# Patient Record
Sex: Male | Born: 1970
Health system: Southern US, Community
[De-identification: ages and names within clinical notes are randomized; demographics above are authoritative.]

## PROBLEM LIST (undated history)

## (undated) HISTORY — PX: BREAST SURGERY: SHX581

---

## 2006-03-10 ENCOUNTER — Emergency Department (HOSPITAL_COMMUNITY): Admission: EM | Admit: 2006-03-10 | Discharge: 2006-03-10 | Payer: Self-pay | Admitting: Emergency Medicine

## 2017-06-21 DIAGNOSIS — G5602 Carpal tunnel syndrome, left upper limb: Secondary | ICD-10-CM | POA: Diagnosis not present

## 2018-06-02 DIAGNOSIS — J019 Acute sinusitis, unspecified: Secondary | ICD-10-CM | POA: Diagnosis not present

## 2018-06-21 DIAGNOSIS — N209 Urinary calculus, unspecified: Secondary | ICD-10-CM | POA: Diagnosis not present

## 2018-09-17 DIAGNOSIS — M7061 Trochanteric bursitis, right hip: Secondary | ICD-10-CM | POA: Diagnosis not present

## 2018-09-17 DIAGNOSIS — S76911A Strain of unspecified muscles, fascia and tendons at thigh level, right thigh, initial encounter: Secondary | ICD-10-CM | POA: Diagnosis not present

## 2018-09-20 DIAGNOSIS — M545 Low back pain: Secondary | ICD-10-CM | POA: Diagnosis not present

## 2018-09-20 DIAGNOSIS — M5416 Radiculopathy, lumbar region: Secondary | ICD-10-CM | POA: Diagnosis not present

## 2018-10-01 ENCOUNTER — Ambulatory Visit (HOSPITAL_COMMUNITY)
Admission: RE | Admit: 2018-10-01 | Discharge: 2018-10-01 | Disposition: A | Discharge status: Home / Self Care | Payer: 59 | Source: Ambulatory Visit | Attending: Physician Assistant | Admitting: Physician Assistant

## 2018-10-01 ENCOUNTER — Other Ambulatory Visit (HOSPITAL_COMMUNITY): Payer: Self-pay | Admitting: Physician Assistant

## 2018-10-01 DIAGNOSIS — M160 Bilateral primary osteoarthritis of hip: Secondary | ICD-10-CM | POA: Diagnosis not present

## 2018-10-01 DIAGNOSIS — M48061 Spinal stenosis, lumbar region without neurogenic claudication: Secondary | ICD-10-CM | POA: Insufficient documentation

## 2018-10-01 DIAGNOSIS — M4606 Spinal enthesopathy, lumbar region: Secondary | ICD-10-CM | POA: Diagnosis not present

## 2018-10-01 DIAGNOSIS — M24852 Other specific joint derangements of left hip, not elsewhere classified: Secondary | ICD-10-CM | POA: Diagnosis not present

## 2018-10-01 DIAGNOSIS — M47896 Other spondylosis, lumbar region: Secondary | ICD-10-CM | POA: Diagnosis not present

## 2018-10-01 DIAGNOSIS — M79604 Pain in right leg: Secondary | ICD-10-CM | POA: Diagnosis not present

## 2018-10-01 DIAGNOSIS — M549 Dorsalgia, unspecified: Secondary | ICD-10-CM | POA: Diagnosis not present

## 2018-10-01 DIAGNOSIS — M24851 Other specific joint derangements of right hip, not elsewhere classified: Secondary | ICD-10-CM | POA: Diagnosis not present

## 2018-10-05 DIAGNOSIS — M79604 Pain in right leg: Secondary | ICD-10-CM | POA: Diagnosis not present

## 2018-10-05 DIAGNOSIS — M4696 Unspecified inflammatory spondylopathy, lumbar region: Secondary | ICD-10-CM | POA: Diagnosis not present

## 2018-10-05 DIAGNOSIS — M5416 Radiculopathy, lumbar region: Secondary | ICD-10-CM | POA: Diagnosis not present

## 2018-10-27 DIAGNOSIS — M545 Low back pain: Secondary | ICD-10-CM | POA: Diagnosis not present

## 2018-10-27 DIAGNOSIS — M5416 Radiculopathy, lumbar region: Secondary | ICD-10-CM | POA: Diagnosis not present

## 2018-11-09 DIAGNOSIS — M5416 Radiculopathy, lumbar region: Secondary | ICD-10-CM | POA: Diagnosis not present

## 2018-11-18 DIAGNOSIS — M5416 Radiculopathy, lumbar region: Secondary | ICD-10-CM | POA: Diagnosis not present

## 2018-11-23 DIAGNOSIS — M5416 Radiculopathy, lumbar region: Secondary | ICD-10-CM | POA: Diagnosis not present

## 2018-12-01 DIAGNOSIS — M5416 Radiculopathy, lumbar region: Secondary | ICD-10-CM | POA: Diagnosis not present

## 2019-01-11 DIAGNOSIS — Z6841 Body Mass Index (BMI) 40.0 and over, adult: Secondary | ICD-10-CM | POA: Diagnosis not present

## 2019-01-11 DIAGNOSIS — Z0000 Encounter for general adult medical examination without abnormal findings: Secondary | ICD-10-CM | POA: Diagnosis not present

## 2019-01-11 DIAGNOSIS — Z1389 Encounter for screening for other disorder: Secondary | ICD-10-CM | POA: Diagnosis not present

## 2019-02-13 IMAGING — DX DG LUMBAR SPINE 2-3V
3 series · 3 of 3 positions shown · non-contrast
Comparison: 03/10/2016

CLINICAL DATA: Right-sided low back pain radiating to the right hip

EXAM:
LUMBAR SPINE - 2-3 VIEW

[l-spine ap]
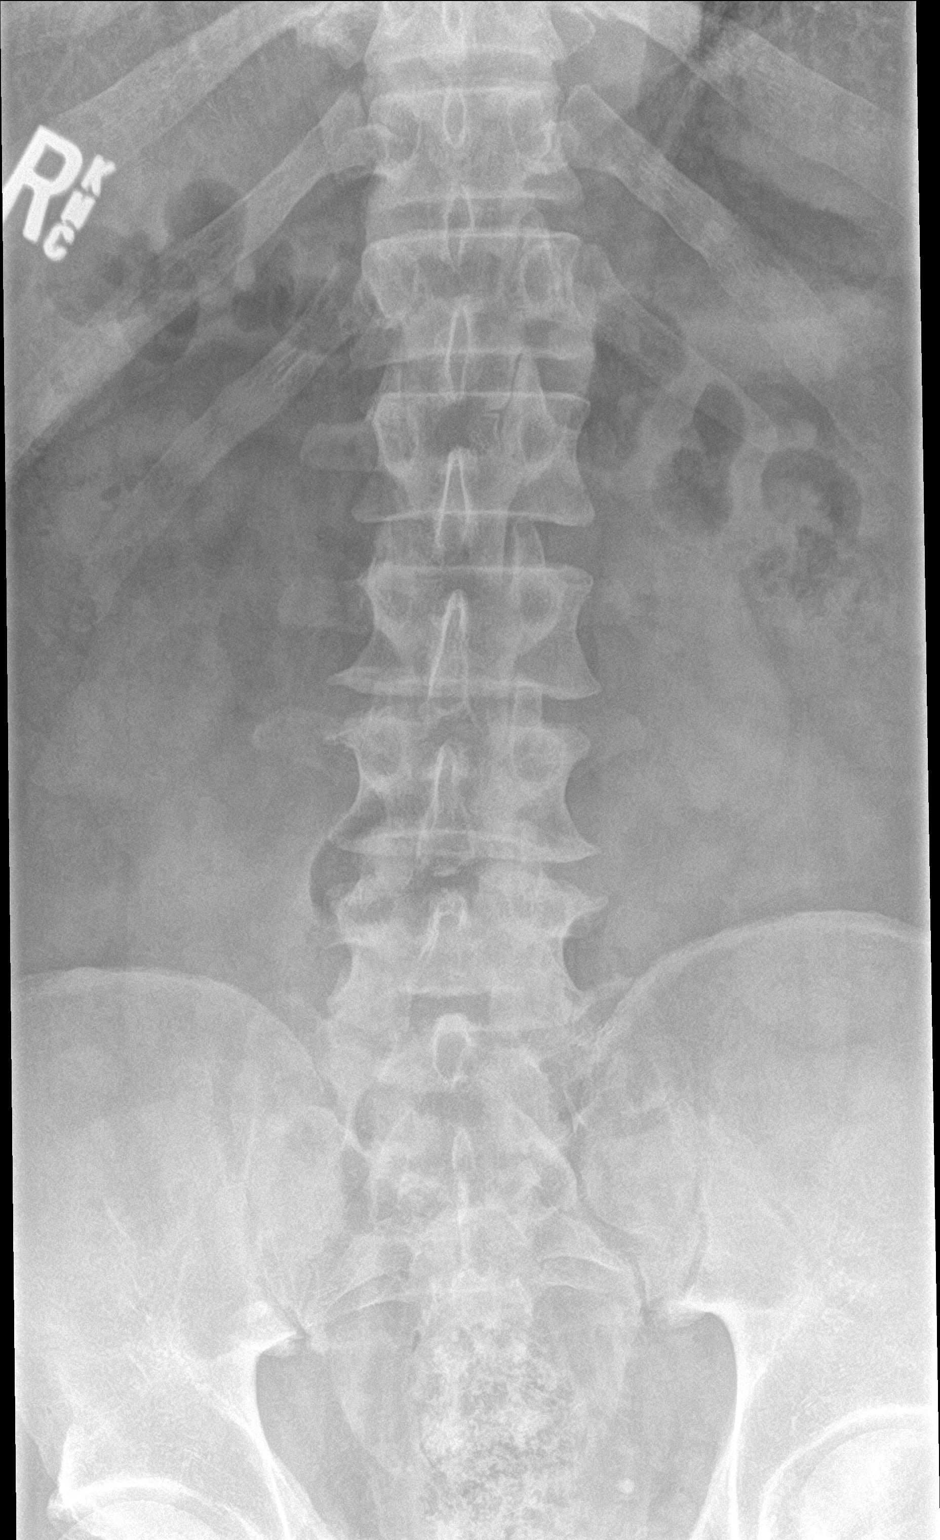

[l-spine lat]
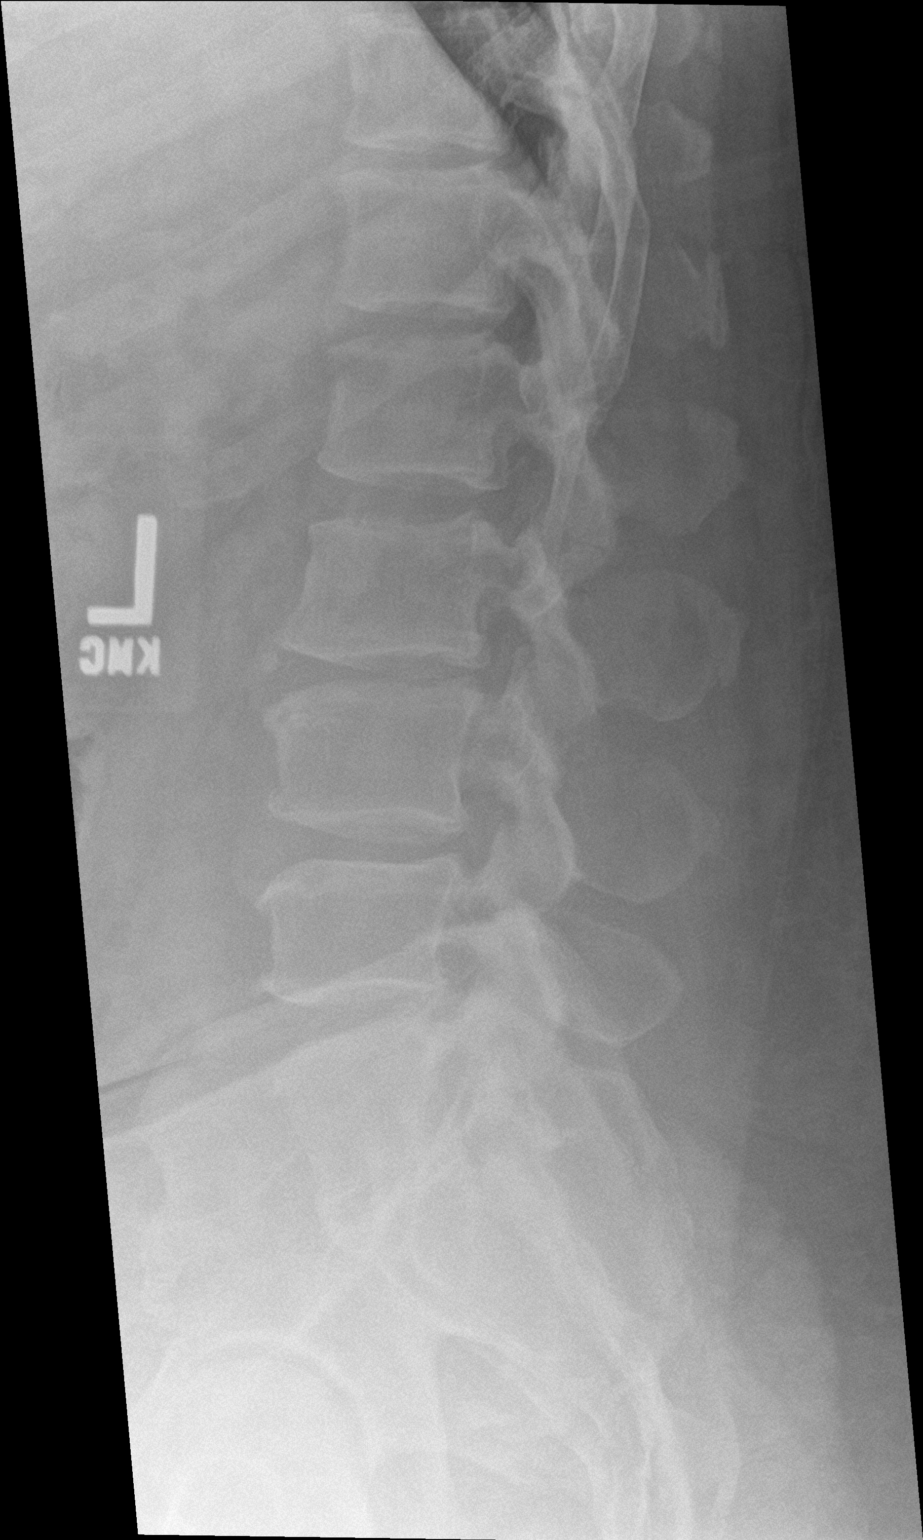

[l-spine spot]
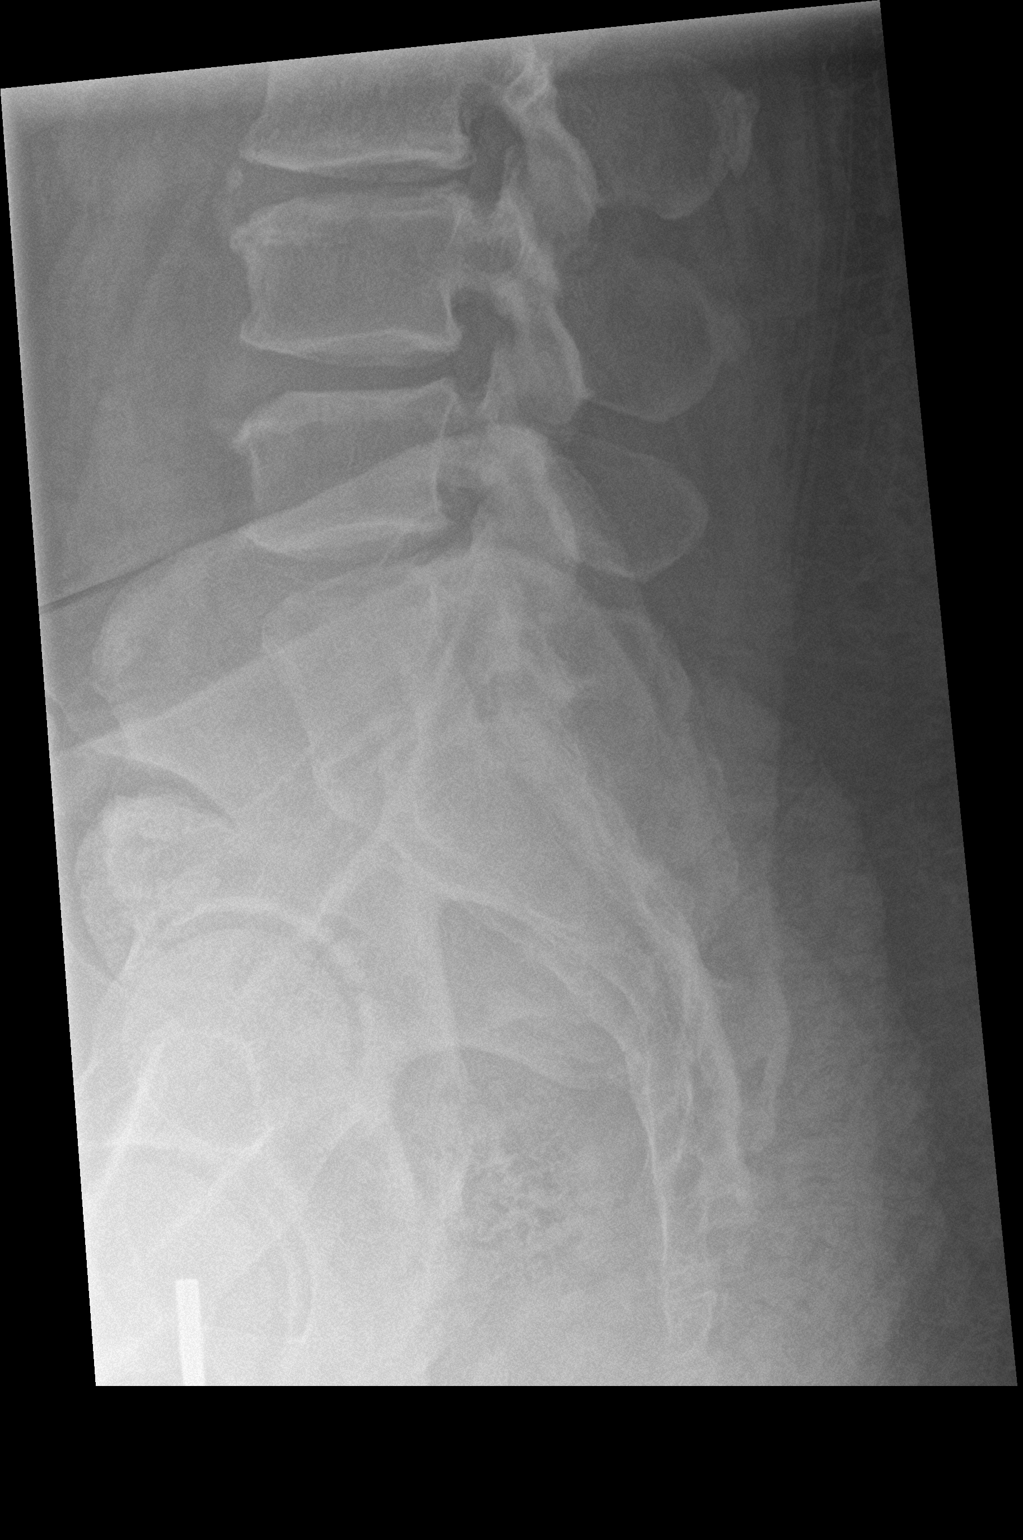

[3 of 3 positions shown; findings below may reference images not displayed]

FINDINGS: Transitional L5 vertebra based on the lowest ribs. There is
generalized disc narrowing and endplate spurring. Negative facets.
No evidence of fracture or bone lesion.
IMPRESSION: 1. Transitional L5 vertebra.
2. Disc narrowing and endplate spurring at L2-3 and below.

## 2019-02-13 IMAGING — DX DG HIP (WITH OR WITHOUT PELVIS) 2-3V*R*
3 series · 3 of 3 positions shown · non-contrast
Comparison: No recent.

CLINICAL DATA: Right leg pain.

EXAM:
DG HIP (WITH OR WITHOUT PELVIS) 2-3V RIGHT

[pelvis ap]
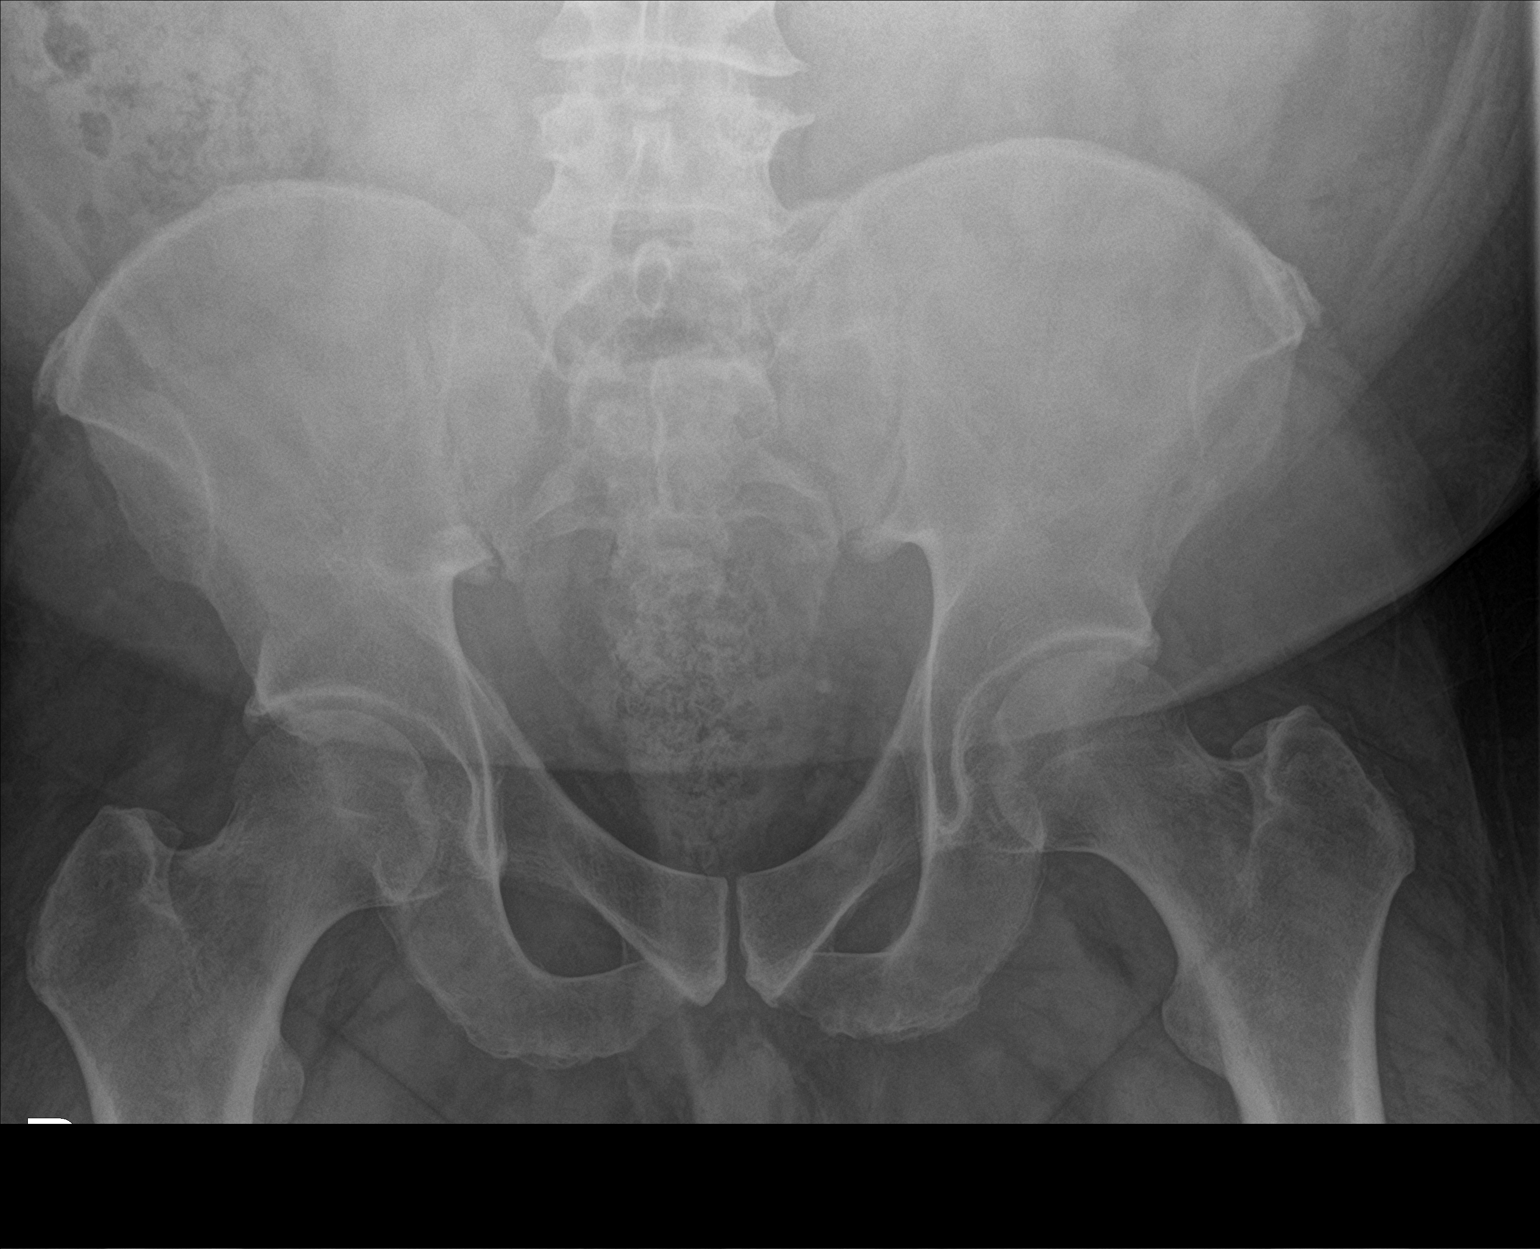

[hip ap]
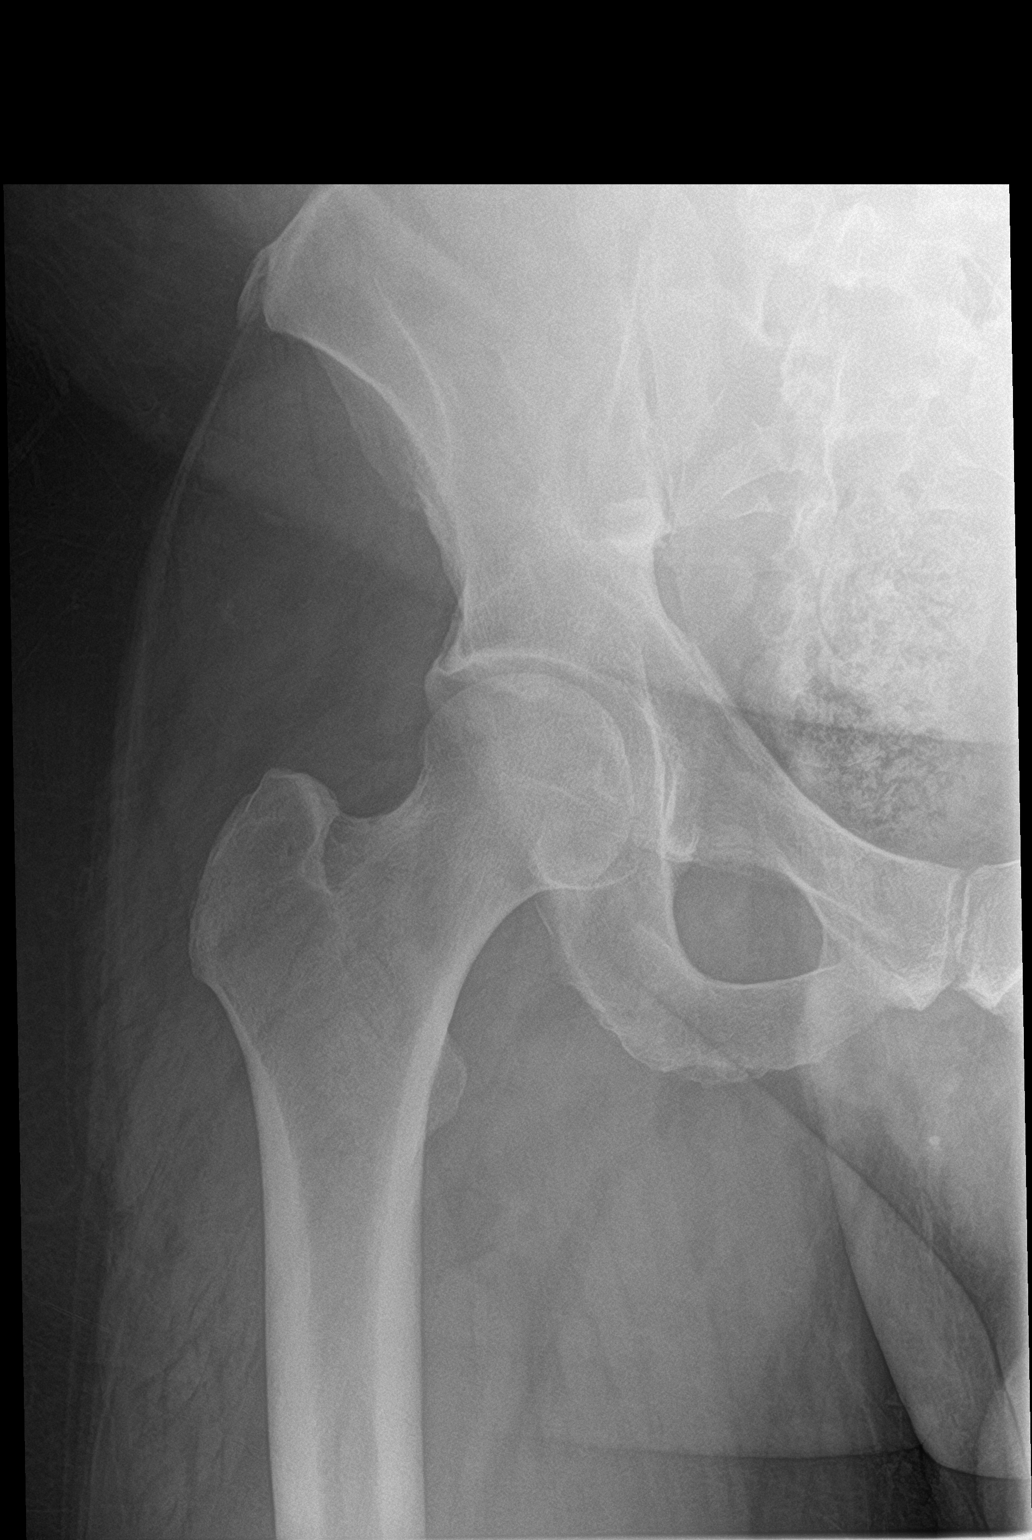

[hip lat]
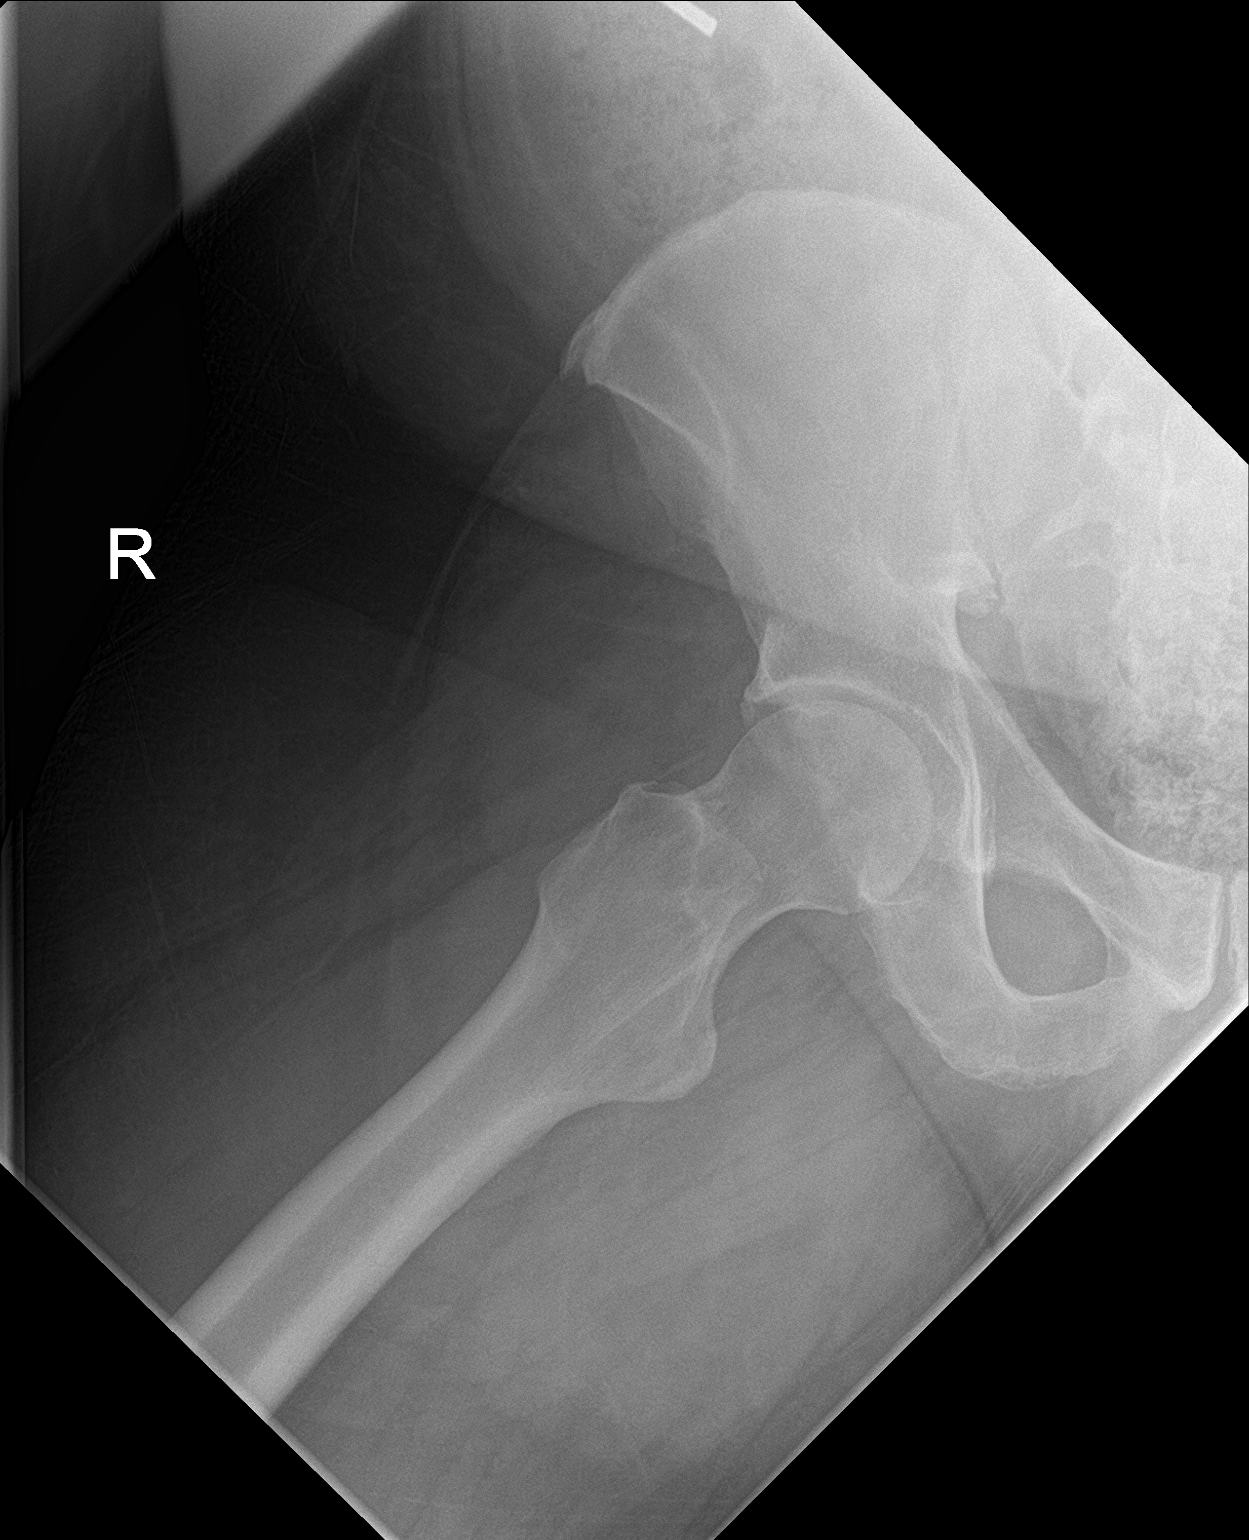

[3 of 3 positions shown; findings below may reference images not displayed]

FINDINGS: Degenerative changes lumbar spine and both hips. No acute bony
abnormality identified. No evidence of fracture or dislocation.
Pelvic calcifications consistent phleboliths.
IMPRESSION: Degenerative changes lumbar spine and both hips. No acute bony
abnormality identified.

## 2019-09-21 DIAGNOSIS — Z20828 Contact with and (suspected) exposure to other viral communicable diseases: Secondary | ICD-10-CM | POA: Diagnosis not present

## 2019-10-10 ENCOUNTER — Other Ambulatory Visit: Payer: Self-pay

## 2019-10-10 DIAGNOSIS — Z20822 Contact with and (suspected) exposure to covid-19: Secondary | ICD-10-CM

## 2019-10-10 DIAGNOSIS — J22 Unspecified acute lower respiratory infection: Secondary | ICD-10-CM | POA: Diagnosis not present

## 2019-10-11 LAB — NOVEL CORONAVIRUS, NAA: SARS-CoV-2, NAA: DETECTED — AB

## 2020-05-16 DIAGNOSIS — R5383 Other fatigue: Secondary | ICD-10-CM | POA: Diagnosis not present

## 2020-05-16 DIAGNOSIS — L918 Other hypertrophic disorders of the skin: Secondary | ICD-10-CM | POA: Diagnosis not present

## 2020-05-16 DIAGNOSIS — D179 Benign lipomatous neoplasm, unspecified: Secondary | ICD-10-CM | POA: Diagnosis not present

## 2020-05-16 DIAGNOSIS — Z0001 Encounter for general adult medical examination with abnormal findings: Secondary | ICD-10-CM | POA: Diagnosis not present

## 2020-05-16 DIAGNOSIS — Z0000 Encounter for general adult medical examination without abnormal findings: Secondary | ICD-10-CM | POA: Diagnosis not present

## 2020-05-16 DIAGNOSIS — R7309 Other abnormal glucose: Secondary | ICD-10-CM | POA: Diagnosis not present

## 2020-05-16 DIAGNOSIS — Z1389 Encounter for screening for other disorder: Secondary | ICD-10-CM | POA: Diagnosis not present

## 2020-05-16 DIAGNOSIS — Z6841 Body Mass Index (BMI) 40.0 and over, adult: Secondary | ICD-10-CM | POA: Diagnosis not present

## 2020-06-04 DIAGNOSIS — L570 Actinic keratosis: Secondary | ICD-10-CM | POA: Diagnosis not present

## 2020-06-21 DIAGNOSIS — D485 Neoplasm of uncertain behavior of skin: Secondary | ICD-10-CM | POA: Diagnosis not present

## 2020-06-21 DIAGNOSIS — L720 Epidermal cyst: Secondary | ICD-10-CM | POA: Diagnosis not present

## 2020-12-11 DIAGNOSIS — S134XXA Sprain of ligaments of cervical spine, initial encounter: Secondary | ICD-10-CM | POA: Diagnosis not present

## 2020-12-11 DIAGNOSIS — S338XXA Sprain of other parts of lumbar spine and pelvis, initial encounter: Secondary | ICD-10-CM | POA: Diagnosis not present

## 2020-12-18 DIAGNOSIS — S134XXA Sprain of ligaments of cervical spine, initial encounter: Secondary | ICD-10-CM | POA: Diagnosis not present

## 2020-12-18 DIAGNOSIS — S338XXA Sprain of other parts of lumbar spine and pelvis, initial encounter: Secondary | ICD-10-CM | POA: Diagnosis not present

## 2020-12-20 DIAGNOSIS — S134XXA Sprain of ligaments of cervical spine, initial encounter: Secondary | ICD-10-CM | POA: Diagnosis not present

## 2020-12-20 DIAGNOSIS — S338XXA Sprain of other parts of lumbar spine and pelvis, initial encounter: Secondary | ICD-10-CM | POA: Diagnosis not present

## 2020-12-27 DIAGNOSIS — S134XXA Sprain of ligaments of cervical spine, initial encounter: Secondary | ICD-10-CM | POA: Diagnosis not present

## 2020-12-27 DIAGNOSIS — S338XXA Sprain of other parts of lumbar spine and pelvis, initial encounter: Secondary | ICD-10-CM | POA: Diagnosis not present

## 2021-01-03 DIAGNOSIS — S134XXA Sprain of ligaments of cervical spine, initial encounter: Secondary | ICD-10-CM | POA: Diagnosis not present

## 2021-01-03 DIAGNOSIS — S338XXA Sprain of other parts of lumbar spine and pelvis, initial encounter: Secondary | ICD-10-CM | POA: Diagnosis not present

## 2021-01-17 DIAGNOSIS — S134XXA Sprain of ligaments of cervical spine, initial encounter: Secondary | ICD-10-CM | POA: Diagnosis not present

## 2021-01-17 DIAGNOSIS — S338XXA Sprain of other parts of lumbar spine and pelvis, initial encounter: Secondary | ICD-10-CM | POA: Diagnosis not present

## 2021-01-31 DIAGNOSIS — S134XXA Sprain of ligaments of cervical spine, initial encounter: Secondary | ICD-10-CM | POA: Diagnosis not present

## 2021-01-31 DIAGNOSIS — S338XXA Sprain of other parts of lumbar spine and pelvis, initial encounter: Secondary | ICD-10-CM | POA: Diagnosis not present

## 2021-02-14 DIAGNOSIS — S134XXA Sprain of ligaments of cervical spine, initial encounter: Secondary | ICD-10-CM | POA: Diagnosis not present

## 2021-02-14 DIAGNOSIS — S338XXA Sprain of other parts of lumbar spine and pelvis, initial encounter: Secondary | ICD-10-CM | POA: Diagnosis not present

## 2021-02-25 ENCOUNTER — Ambulatory Visit: Payer: BC Managed Care – PPO | Admitting: Podiatry

## 2021-02-25 DIAGNOSIS — L918 Other hypertrophic disorders of the skin: Secondary | ICD-10-CM | POA: Diagnosis not present

## 2021-02-25 DIAGNOSIS — I781 Nevus, non-neoplastic: Secondary | ICD-10-CM | POA: Diagnosis not present

## 2021-02-25 DIAGNOSIS — D216 Benign neoplasm of connective and other soft tissue of trunk, unspecified: Secondary | ICD-10-CM | POA: Diagnosis not present

## 2021-02-25 DIAGNOSIS — D485 Neoplasm of uncertain behavior of skin: Secondary | ICD-10-CM | POA: Diagnosis not present

## 2021-02-27 ENCOUNTER — Other Ambulatory Visit: Payer: Self-pay

## 2021-02-27 ENCOUNTER — Encounter: Payer: Self-pay | Admitting: Podiatry

## 2021-02-27 ENCOUNTER — Ambulatory Visit: Payer: BC Managed Care – PPO | Admitting: Podiatry

## 2021-02-27 DIAGNOSIS — B351 Tinea unguium: Secondary | ICD-10-CM

## 2021-02-27 MED ORDER — TERBINAFINE HCL 250 MG PO TABS: 250.0000 mg | ORAL_TABLET | Freq: Every day | ORAL | 0 refills | Status: DC

## 2021-02-27 NOTE — Progress Notes (Signed)
Subjective:   Patient ID: William Morris, male   DOB: 50 y.o.   MRN: [id number]   HPI Patient presents stating has had some thick toenails that of time of been sore he does not like the look of them and states the left is worse with the right also somewhat off.  Patient does have some skin issues also and states that they can be itchy.  Patient does not smoke likes to be active   Review of Systems  All other systems reviewed and are negative.       Objective:  Physical Exam Vitals and nursing note reviewed.  Constitutional:      Appearance: He is well-developed and well-nourished.  Cardiovascular:     Pulses: Intact distal pulses.  Pulmonary:     Effort: Pulmonary effort is normal.  Musculoskeletal:        General: Normal range of motion.  Skin:    General: Skin is warm.  Neurological:     Mental Status: He is alert.     Neurovascular status intact muscle strength found to be adequate range of motion adequate.  Patient is found to have a thickened hallux nail left over right with discoloration of the bed and subungual changes along with skin that gets irritated 2.  No blisters were noted several of the nails have mild involvement     Assessment:  Combination of mycotic nail infection with probable trauma and does wear steel toe shoes for his second type of job     Plan:  H&P reviewed condition at great length discussed different treatment options.  I also discussed possible nail removal and educated him on this and at this point we are going to just focus on this conservatively.  Ominous start him on Lamisil for 60 days I am getting get liver function studies and he was given requisition and will get a start him on laser and possible topical.  I educated him on other treatment options which may be necessary and explained at great length the complications of medication and the fact this may or may not solve his problem

## 2021-02-28 DIAGNOSIS — S338XXA Sprain of other parts of lumbar spine and pelvis, initial encounter: Secondary | ICD-10-CM | POA: Diagnosis not present

## 2021-02-28 DIAGNOSIS — S134XXA Sprain of ligaments of cervical spine, initial encounter: Secondary | ICD-10-CM | POA: Diagnosis not present

## 2021-02-28 LAB — HEPATIC FUNCTION PANEL
AG Ratio: 1.5 (calc) (ref 1.0–2.5)
ALT: 15 U/L (ref 9–46)
AST: 13 U/L (ref 10–40)
Albumin: 4.1 g/dL (ref 3.6–5.1)
Alkaline phosphatase (APISO): 59 U/L (ref 36–130)
Bilirubin, Direct: 0.1 mg/dL (ref 0.0–0.2)
Globulin: 2.8 g/dL (calc) (ref 1.9–3.7)
Indirect Bilirubin: 0.3 mg/dL (calc) (ref 0.2–1.2)
Total Bilirubin: 0.4 mg/dL (ref 0.2–1.2)
Total Protein: 6.9 g/dL (ref 6.1–8.1)

## 2021-03-01 ENCOUNTER — Telehealth: Payer: Self-pay | Admitting: Podiatry

## 2021-03-01 NOTE — Telephone Encounter (Signed)
Pt wife called and stated that her husband prescription was not sent to the local [facility] in [place] and wanted to know if it could be resent

## 2021-03-04 ENCOUNTER — Telehealth: Payer: Self-pay | Admitting: Podiatry

## 2021-03-04 NOTE — Telephone Encounter (Signed)
Pt wife called a second time and stated that her husband prescription was not sent to the local [facility] in [place] and wanted to know if it could be resent. Pharmacy has not received prescription

## 2021-03-05 ENCOUNTER — Other Ambulatory Visit: Payer: Self-pay

## 2021-03-05 DIAGNOSIS — B351 Tinea unguium: Secondary | ICD-10-CM

## 2021-03-05 MED ORDER — TERBINAFINE HCL 250 MG PO TABS: 250.0000 mg | ORAL_TABLET | Freq: Every day | ORAL | 0 refills | Status: AC

## 2021-03-05 NOTE — Telephone Encounter (Signed)
Lamisil Rx called into [facility]. Patient will be notified by pharmacy when it is ready for pick-up.

## 2021-03-05 NOTE — Telephone Encounter (Signed)
Called patient to let him know about the Lamisil being called in. There was a contact form that was submitted to the website about it.

## 2021-03-21 DIAGNOSIS — S338XXA Sprain of other parts of lumbar spine and pelvis, initial encounter: Secondary | ICD-10-CM | POA: Diagnosis not present

## 2021-03-21 DIAGNOSIS — S134XXA Sprain of ligaments of cervical spine, initial encounter: Secondary | ICD-10-CM | POA: Diagnosis not present

## 2021-04-09 DIAGNOSIS — S338XXA Sprain of other parts of lumbar spine and pelvis, initial encounter: Secondary | ICD-10-CM | POA: Diagnosis not present

## 2021-04-09 DIAGNOSIS — S134XXA Sprain of ligaments of cervical spine, initial encounter: Secondary | ICD-10-CM | POA: Diagnosis not present

## 2021-04-10 ENCOUNTER — Telehealth: Payer: Self-pay | Admitting: Podiatry

## 2021-04-10 NOTE — Telephone Encounter (Signed)
Everything was normal. You can let them know

## 2021-04-10 NOTE — Telephone Encounter (Signed)
Pt wife called and stated that her husband had some lab work done a month ago and no one has called to follow up. Please call patient with results.

## 2021-04-11 NOTE — Telephone Encounter (Signed)
Left voicemail for patient, I did state if they wanted to schedule a follow up with Dr. Charlsie Merles, please call the office.

## 2021-04-11 NOTE — Telephone Encounter (Signed)
Don't need appointment

## 2021-07-12 DIAGNOSIS — W57XXXA Bitten or stung by nonvenomous insect and other nonvenomous arthropods, initial encounter: Secondary | ICD-10-CM | POA: Diagnosis not present

## 2021-07-12 DIAGNOSIS — Z1331 Encounter for screening for depression: Secondary | ICD-10-CM | POA: Diagnosis not present

## 2021-07-12 DIAGNOSIS — T07XXXA Unspecified multiple injuries, initial encounter: Secondary | ICD-10-CM | POA: Diagnosis not present

## 2021-07-12 DIAGNOSIS — Z6841 Body Mass Index (BMI) 40.0 and over, adult: Secondary | ICD-10-CM | POA: Diagnosis not present

## 2022-01-20 DIAGNOSIS — R7309 Other abnormal glucose: Secondary | ICD-10-CM | POA: Diagnosis not present

## 2022-01-20 DIAGNOSIS — Z13220 Encounter for screening for lipoid disorders: Secondary | ICD-10-CM | POA: Diagnosis not present

## 2022-01-20 DIAGNOSIS — Z0000 Encounter for general adult medical examination without abnormal findings: Secondary | ICD-10-CM | POA: Diagnosis not present

## 2023-01-26 DIAGNOSIS — E119 Type 2 diabetes mellitus without complications: Secondary | ICD-10-CM | POA: Diagnosis not present

## 2023-01-26 DIAGNOSIS — R001 Bradycardia, unspecified: Secondary | ICD-10-CM | POA: Diagnosis not present

## 2023-01-26 DIAGNOSIS — Z0000 Encounter for general adult medical examination without abnormal findings: Secondary | ICD-10-CM | POA: Diagnosis not present

## 2023-01-26 DIAGNOSIS — Z13220 Encounter for screening for lipoid disorders: Secondary | ICD-10-CM | POA: Diagnosis not present

## 2023-01-26 DIAGNOSIS — Z6841 Body Mass Index (BMI) 40.0 and over, adult: Secondary | ICD-10-CM | POA: Diagnosis not present

## 2023-01-26 DIAGNOSIS — R350 Frequency of micturition: Secondary | ICD-10-CM | POA: Diagnosis not present

## 2023-02-27 ENCOUNTER — Encounter: Payer: Self-pay | Admitting: *Deleted

## 2023-03-03 ENCOUNTER — Encounter: Payer: Self-pay | Admitting: Internal Medicine

## 2023-03-03 ENCOUNTER — Ambulatory Visit: Payer: BC Managed Care – PPO | Attending: Internal Medicine | Admitting: Internal Medicine

## 2023-03-03 VITALS — BP 120/68 | HR 51 | Ht 69.0 in | Wt 298.0 lb

## 2023-03-03 DIAGNOSIS — R011 Cardiac murmur, unspecified: Secondary | ICD-10-CM

## 2023-03-03 DIAGNOSIS — R001 Bradycardia, unspecified: Secondary | ICD-10-CM | POA: Diagnosis not present

## 2023-03-03 DIAGNOSIS — E039 Hypothyroidism, unspecified: Secondary | ICD-10-CM

## 2023-03-03 NOTE — Patient Instructions (Addendum)
Medication Instructions:  Your physician recommends that you continue on your current medications as directed. Please refer to the Current Medication list given to you today.  Labwork: none  Testing/Procedures: Your physician has requested that you have an echocardiogram. Echocardiography is a painless test that uses sound waves to create images of your heart. It provides your doctor with information about the size and shape of your heart and how well your heart's chambers and valves are working. This procedure takes approximately one hour. There are no restrictions for this procedure. Please do NOT wear cologne, perfume, aftershave, or lotions (deodorant is allowed). Please arrive 15 minutes prior to your appointment time.  Follow-Up: Your physician recommends that you schedule a follow-up appointment in: 1 year. You will receive a reminder call in the mail in about 10 months reminding you to call and schedule your appointment. If you don't receive this call, please contact our office.  Any Other Special Instructions Will Be Listed Below (If Applicable).  If you need a refill on your cardiac medications before your next appointment, please call your pharmacy.

## 2023-03-03 NOTE — Progress Notes (Signed)
Cardiology Office Note  Date: 03/03/2023   ID: William Morris, DOB 1971-07-09, MRN [id number]  PCP:  Cory Munch, PA-C  Cardiologist:  Chalmers Guest, MD Electrophysiologist:  None   Reason for Office Visit: Sinus bradycardia evaluation at the request of Dr. Hilma Favors   History of Present Illness: William Morris is a 52 y.o. male known to have hypothyroidism, diet-controlled DM2 was referred to cardiology clinic for evaluation of sinus bradycardia.  Patient reported that he was never told he had slow heart rate until recently at his PCPs office. He was also diagnosed with hypothyroidism recently and he was placed on levothyroxine 50 mcg daily. Never tested for OSA in the past. Denies any chest pain, SOB, fatigue, leg swelling.  He goes to the gym, walks on the treadmill with incline for about 25 minutes and then lifts weights without any symptoms of fatigue/SOB.  He also said he had a murmur as a kid, was told he had a hole in his heart and was followed up with serial echocardiograms until he was 52 years old boy.  Her cardiologist.   Past Surgical History:  Procedure Laterality Date   BREAST SURGERY Left    lump removed from left breast    Current Outpatient Medications  Medication Sig Dispense Refill   terbinafine (LAMISIL) 250 MG tablet Take 1 tablet (250 mg total) by mouth daily. 60 tablet 0   No current facility-administered medications for this visit.   Allergies:  Patient has no known allergies.   Social History: The patient  reports that he has never smoked. He has never used smokeless tobacco. He reports that he does not drink alcohol and does not use drugs.   Family History: The patient's family history includes Brain cancer in his father; Breast cancer in his mother; Heart attack in his maternal grandmother.   ROS:  Please see the history of present illness. Otherwise, complete review of systems is positive for none.  All other systems are reviewed and  negative.   Physical Exam: VS:  BP 120/68 (BP Location: Right Arm, Patient Position: Sitting, Cuff Size: Large)   Pulse (!) 51   Ht '5\' 9"'$  (1.753 m)   Wt 298 lb (135.2 kg)   SpO2 97%   BMI 44.01 kg/m , BMI Body mass index is 44.01 kg/m.  Wt Readings from Last 3 Encounters:  03/03/23 298 lb (135.2 kg)    General: Patient appears comfortable at rest. HEENT: Conjunctiva and lids normal, oropharynx clear with moist mucosa. Neck: Supple, no elevated JVP or carotid bruits, no thyromegaly. Lungs: Clear to auscultation, nonlabored breathing at rest. Cardiac: Systolic murmur is present Abdomen: Soft, nontender, no hepatomegaly, bowel sounds present, no guarding or rebound. Extremities: No pitting edema, distal pulses 2+. Skin: Warm and dry. Musculoskeletal: No kyphosis. Neuropsychiatric: Alert and oriented x3, affect grossly appropriate.  ECG: Sinus bradycardia, HR 51 bpm  Recent Labwork: No results found for requested labs within last 365 days.  No results found for: "CHOL", "TRIG", "HDL", "CHOLHDL", "VLDL", "LDLCALC", "LDLDIRECT"  Other Studies Reviewed Today: I personally reviewed the PCP heart care referral records  Assessment and Plan: Patient is a 52 year old M known to have diet-controlled DM2, hypothyroidism was referred to the cardiology clinic for evaluation of sinus bradycardia.  # Sinus bradycardia -Patient was recently diagnosed with hypothyroidism and was placed on levothyroxine 50 mcg once daily. Sinus bradycardia could likely be secondary to hypothyroidism.  He will also benefit from sleep apnea testing as his STOP-BANG  score is 4. Regardless, he does not appear to be chronotropically incompetent as he goes to gym, walks on the treadmill with incline for about 25 minutes and then lifts weights without any symptoms of fatigue/SOB.  # Systolic murmur -Patient was told he had a murmur/hole in his heart when he was a kid and was followed up with serial echocardiograms  daily he was 52 years old.Obtain 2D echocardiogram.    I have spent a total of 45 minutes with patient reviewing chart, EKGs, labs and examining patient as well as establishing an assessment and plan that was discussed with the patient.  > 50% of time was spent in direct patient care.      Medication Adjustments/Labs and Tests Ordered: Current medicines are reviewed at length with the patient today.  Concerns regarding medicines are outlined above.   Tests Ordered: Orders Placed This Encounter  Procedures   EKG 12-Lead   ECHOCARDIOGRAM COMPLETE    Medication Changes: No orders of the defined types were placed in this encounter.   Disposition:  Follow up  1 year  Signed Manilla Strieter Fidel Levy, MD, 03/03/2023 10:31 AM    [facility] [place], [place], [place] [address]

## 2023-03-19 ENCOUNTER — Ambulatory Visit: Payer: BC Managed Care – PPO | Attending: Internal Medicine

## 2023-03-19 DIAGNOSIS — R011 Cardiac murmur, unspecified: Secondary | ICD-10-CM

## 2023-03-19 LAB — ECHOCARDIOGRAM COMPLETE
AR max vel: 2.14 cm2
AV Area VTI: 2.02 cm2
AV Area mean vel: 2.08 cm2
AV Mean grad: 6 mmHg
AV Peak grad: 11.9 mmHg
Ao pk vel: 1.72 m/s
Area-P 1/2: 3.5 cm2
Calc EF: 63 %
S' Lateral: 2 cm
Single Plane A2C EF: 68.6 %
Single Plane A4C EF: 61.3 %

## 2023-03-25 ENCOUNTER — Other Ambulatory Visit: Payer: BC Managed Care – PPO

## 2023-03-30 ENCOUNTER — Telehealth: Payer: Self-pay | Admitting: Internal Medicine

## 2023-03-30 NOTE — Telephone Encounter (Signed)
Patient made aware via results note. 

## 2023-03-30 NOTE — Telephone Encounter (Signed)
Patient returning call for echo results. 

## 2023-03-31 DIAGNOSIS — Z01818 Encounter for other preprocedural examination: Secondary | ICD-10-CM | POA: Diagnosis not present

## 2023-03-31 DIAGNOSIS — Z1211 Encounter for screening for malignant neoplasm of colon: Secondary | ICD-10-CM | POA: Diagnosis not present

## 2023-05-18 DIAGNOSIS — D120 Benign neoplasm of cecum: Secondary | ICD-10-CM | POA: Diagnosis not present

## 2023-05-18 DIAGNOSIS — Z1211 Encounter for screening for malignant neoplasm of colon: Secondary | ICD-10-CM | POA: Diagnosis not present

## 2023-07-09 DIAGNOSIS — B356 Tinea cruris: Secondary | ICD-10-CM | POA: Diagnosis not present

## 2023-12-14 DIAGNOSIS — L570 Actinic keratosis: Secondary | ICD-10-CM | POA: Diagnosis not present

## 2023-12-14 DIAGNOSIS — D485 Neoplasm of uncertain behavior of skin: Secondary | ICD-10-CM | POA: Diagnosis not present

## 2023-12-29 DIAGNOSIS — D485 Neoplasm of uncertain behavior of skin: Secondary | ICD-10-CM | POA: Diagnosis not present

## 2024-02-09 DIAGNOSIS — R7309 Other abnormal glucose: Secondary | ICD-10-CM | POA: Diagnosis not present

## 2024-02-09 DIAGNOSIS — E039 Hypothyroidism, unspecified: Secondary | ICD-10-CM | POA: Diagnosis not present

## 2024-02-09 DIAGNOSIS — Z6841 Body Mass Index (BMI) 40.0 and over, adult: Secondary | ICD-10-CM | POA: Diagnosis not present

## 2024-02-09 DIAGNOSIS — E119 Type 2 diabetes mellitus without complications: Secondary | ICD-10-CM | POA: Diagnosis not present

## 2024-02-09 DIAGNOSIS — Z1331 Encounter for screening for depression: Secondary | ICD-10-CM | POA: Diagnosis not present

## 2024-02-09 DIAGNOSIS — Z0000 Encounter for general adult medical examination without abnormal findings: Secondary | ICD-10-CM | POA: Diagnosis not present

## 2024-05-18 DIAGNOSIS — E039 Hypothyroidism, unspecified: Secondary | ICD-10-CM | POA: Diagnosis not present

## 2024-08-15 DIAGNOSIS — E039 Hypothyroidism, unspecified: Secondary | ICD-10-CM | POA: Diagnosis not present

## 2024-08-15 DIAGNOSIS — Z6839 Body mass index (BMI) 39.0-39.9, adult: Secondary | ICD-10-CM | POA: Diagnosis not present

## 2024-08-15 DIAGNOSIS — E6609 Other obesity due to excess calories: Secondary | ICD-10-CM | POA: Diagnosis not present

## 2024-10-03 DIAGNOSIS — Z808 Family history of malignant neoplasm of other organs or systems: Secondary | ICD-10-CM | POA: Diagnosis not present

## 2024-10-03 DIAGNOSIS — Z8489 Family history of other specified conditions: Secondary | ICD-10-CM | POA: Diagnosis not present

## 2024-10-03 DIAGNOSIS — Z8042 Family history of malignant neoplasm of prostate: Secondary | ICD-10-CM | POA: Diagnosis not present

## 2024-10-03 DIAGNOSIS — Z803 Family history of malignant neoplasm of breast: Secondary | ICD-10-CM | POA: Diagnosis not present

## 2024-10-03 DIAGNOSIS — Z8481 Family history of carrier of genetic disease: Secondary | ICD-10-CM | POA: Diagnosis not present

## 2024-10-03 DIAGNOSIS — Z7183 Encounter for nonprocreative genetic counseling: Secondary | ICD-10-CM | POA: Diagnosis not present
